# Patient Record
Sex: Male | Born: 1985 | Race: White | Hispanic: No | Marital: Single | Smoking: Current every day smoker
Health system: Southern US, Community
[De-identification: ages and names within clinical notes are randomized; demographics above are authoritative.]

## PROBLEM LIST (undated history)

## (undated) HISTORY — PX: HERNIA REPAIR: SHX51

---
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
## 2014-04-29 ENCOUNTER — Emergency Department (HOSPITAL_COMMUNITY): Payer: Self-pay

## 2014-04-29 ENCOUNTER — Encounter (HOSPITAL_COMMUNITY): Payer: Self-pay | Admitting: Family Medicine

## 2014-04-29 ENCOUNTER — Emergency Department (HOSPITAL_COMMUNITY)
Admission: EM | Admit: 2014-04-29 | Discharge: 2014-04-29 | Disposition: A | Discharge status: Home / Self Care | Payer: Self-pay | Attending: Emergency Medicine | Admitting: Emergency Medicine

## 2014-04-29 DIAGNOSIS — S023XXA Fracture of orbital floor, initial encounter for closed fracture: Secondary | ICD-10-CM | POA: Insufficient documentation

## 2014-04-29 DIAGNOSIS — Y9389 Activity, other specified: Secondary | ICD-10-CM | POA: Insufficient documentation

## 2014-04-29 DIAGNOSIS — S02402A Zygomatic fracture, unspecified, initial encounter for closed fracture: Secondary | ICD-10-CM | POA: Insufficient documentation

## 2014-04-29 DIAGNOSIS — S0990XA Unspecified injury of head, initial encounter: Secondary | ICD-10-CM | POA: Insufficient documentation

## 2014-04-29 DIAGNOSIS — Z720 Tobacco use: Secondary | ICD-10-CM | POA: Insufficient documentation

## 2014-04-29 DIAGNOSIS — Y998 Other external cause status: Secondary | ICD-10-CM | POA: Insufficient documentation

## 2014-04-29 DIAGNOSIS — Y9289 Other specified places as the place of occurrence of the external cause: Secondary | ICD-10-CM | POA: Insufficient documentation

## 2014-04-29 DIAGNOSIS — S0285XA Fracture of orbit, unspecified, initial encounter for closed fracture: Secondary | ICD-10-CM

## 2014-04-29 MED ORDER — OXYCODONE HCL 5 MG PO TABS: 5.0000 mg | ORAL_TABLET | Freq: Four times a day (QID) | ORAL | Status: AC | PRN

## 2014-04-29 MED ORDER — HYDROMORPHONE HCL 1 MG/ML IJ SOLN
2.0000 mg | Freq: Once | INTRAMUSCULAR | Status: AC
  Administered 2014-04-29: 2 mg via INTRAMUSCULAR
  Filled 2014-04-29: qty 2

## 2014-04-29 MED ORDER — AMOXICILLIN 500 MG PO CAPS: 500.0000 mg | ORAL_CAPSULE | Freq: Three times a day (TID) | ORAL | Status: AC

## 2014-04-29 MED ORDER — ONDANSETRON 4 MG PO TBDP
4.0000 mg | ORAL_TABLET | Freq: Once | ORAL | Status: AC
  Administered 2014-04-29: 4 mg via ORAL
  Filled 2014-04-29: qty 1

## 2014-04-29 MED ORDER — HYDROMORPHONE HCL 1 MG/ML IJ SOLN
1.0000 mg | Freq: Once | INTRAMUSCULAR | Status: AC
  Administered 2014-04-29: 1 mg via INTRAMUSCULAR
  Filled 2014-04-29: qty 1
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
## 2014-04-29 NOTE — ED Notes (Signed)
Provider at bedside
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
## 2014-05-04 ENCOUNTER — Ambulatory Visit (INDEPENDENT_AMBULATORY_CARE_PROVIDER_SITE_OTHER): Payer: Self-pay | Admitting: Otolaryngology

## 2014-11-06 ENCOUNTER — Emergency Department (HOSPITAL_COMMUNITY): Payer: Self-pay

## 2014-11-06 ENCOUNTER — Emergency Department (HOSPITAL_COMMUNITY)
Admission: EM | Admit: 2014-11-06 | Discharge: 2014-11-06 | Disposition: A | Discharge status: Home / Self Care | Payer: Self-pay | Attending: Emergency Medicine | Admitting: Emergency Medicine

## 2014-11-06 ENCOUNTER — Encounter (HOSPITAL_COMMUNITY): Payer: Self-pay | Admitting: *Deleted

## 2014-11-06 DIAGNOSIS — Z87828 Personal history of other (healed) physical injury and trauma: Secondary | ICD-10-CM | POA: Insufficient documentation

## 2014-11-06 DIAGNOSIS — R51 Headache: Secondary | ICD-10-CM | POA: Insufficient documentation

## 2014-11-06 DIAGNOSIS — R519 Headache, unspecified: Secondary | ICD-10-CM

## 2014-11-06 DIAGNOSIS — Z91410 Personal history of adult physical and sexual abuse: Secondary | ICD-10-CM | POA: Insufficient documentation

## 2014-11-06 DIAGNOSIS — Z720 Tobacco use: Secondary | ICD-10-CM | POA: Insufficient documentation

## 2014-11-06 DIAGNOSIS — R6884 Jaw pain: Secondary | ICD-10-CM | POA: Insufficient documentation

## 2014-11-06 MED ORDER — HYDROCODONE-ACETAMINOPHEN 5-325 MG PO TABS
2.0000 | ORAL_TABLET | Freq: Once | ORAL | Status: DC
  Filled 2014-11-06: qty 2

## 2014-11-06 MED ORDER — OXYCODONE HCL 5 MG PO TABS
5.0000 mg | ORAL_TABLET | Freq: Once | ORAL | Status: AC
  Administered 2014-11-06: 5 mg via ORAL
  Filled 2014-11-06: qty 1
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
## 2014-11-06 NOTE — ED Notes (Signed)
Dr James at bedside,  
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
## 2014-11-06 NOTE — ED Notes (Signed)
Pt updated on plan of care,  

## 2014-11-07 ENCOUNTER — Encounter (HOSPITAL_COMMUNITY): Payer: Self-pay | Admitting: Family Medicine
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
